# Patient Record
Sex: Male | Born: 1980 | Race: Black or African American | Hispanic: No | Marital: Single
Health system: Southern US, Community
[De-identification: ages and names within clinical notes are randomized; demographics above are authoritative.]

## PROBLEM LIST (undated history)

## (undated) DIAGNOSIS — J45909 Unspecified asthma, uncomplicated: Secondary | ICD-10-CM

---

## 2004-02-15 ENCOUNTER — Emergency Department (HOSPITAL_COMMUNITY): Admission: EM | Admit: 2004-02-15 | Discharge: 2004-02-15 | Payer: Self-pay | Admitting: Emergency Medicine

## 2007-09-13 ENCOUNTER — Emergency Department (HOSPITAL_COMMUNITY): Admission: EM | Admit: 2007-09-13 | Discharge: 2007-09-14 | Payer: Self-pay | Admitting: Emergency Medicine

## 2010-12-23 LAB — RAPID URINE DRUG SCREEN, HOSP PERFORMED
Amphetamines: NOT DETECTED
Barbiturates: NOT DETECTED
Benzodiazepines: NOT DETECTED
Cocaine: POSITIVE — AB
Opiates: NOT DETECTED
Tetrahydrocannabinol: POSITIVE — AB

## 2010-12-23 LAB — POCT I-STAT, CHEM 8
BUN: 12
Calcium, Ion: 1.24
Chloride: 106
Creatinine, Ser: 1.4
Glucose, Bld: 75
HCT: 44
Hemoglobin: 15
Potassium: 3.7
Sodium: 143
TCO2: 27

## 2010-12-23 LAB — URINALYSIS, ROUTINE W REFLEX MICROSCOPIC
Bilirubin Urine: NEGATIVE
Glucose, UA: NEGATIVE
Hgb urine dipstick: NEGATIVE
Ketones, ur: NEGATIVE
Nitrite: NEGATIVE
Protein, ur: NEGATIVE
Specific Gravity, Urine: 1.022
Urobilinogen, UA: 1
pH: 8.5 — ABNORMAL HIGH

## 2012-02-04 ENCOUNTER — Emergency Department (HOSPITAL_COMMUNITY): Payer: Self-pay

## 2012-02-04 ENCOUNTER — Encounter (HOSPITAL_COMMUNITY): Payer: Self-pay | Admitting: Emergency Medicine

## 2012-02-04 ENCOUNTER — Emergency Department (HOSPITAL_COMMUNITY)
Admission: EM | Admit: 2012-02-04 | Discharge: 2012-02-04 | Disposition: A | Discharge status: Home Health | Payer: Self-pay | Attending: Emergency Medicine | Admitting: Emergency Medicine

## 2012-02-04 DIAGNOSIS — Y9379 Activity, other specified sports and athletics: Secondary | ICD-10-CM | POA: Insufficient documentation

## 2012-02-04 DIAGNOSIS — I10 Essential (primary) hypertension: Secondary | ICD-10-CM | POA: Insufficient documentation

## 2012-02-04 DIAGNOSIS — F17200 Nicotine dependence, unspecified, uncomplicated: Secondary | ICD-10-CM | POA: Insufficient documentation

## 2012-02-04 DIAGNOSIS — S6390XA Sprain of unspecified part of unspecified wrist and hand, initial encounter: Secondary | ICD-10-CM | POA: Insufficient documentation

## 2012-02-04 DIAGNOSIS — Y929 Unspecified place or not applicable: Secondary | ICD-10-CM | POA: Insufficient documentation

## 2012-02-04 DIAGNOSIS — T5991XA Toxic effect of unspecified gases, fumes and vapors, accidental (unintentional), initial encounter: Secondary | ICD-10-CM | POA: Insufficient documentation

## 2012-02-04 DIAGNOSIS — S63602A Unspecified sprain of left thumb, initial encounter: Secondary | ICD-10-CM

## 2012-02-04 HISTORY — DX: Unspecified asthma, uncomplicated: J45.909
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
## 2012-02-04 NOTE — ED Notes (Signed)
Patient transported to X-ray 
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
## 2012-04-14 ENCOUNTER — Emergency Department (HOSPITAL_COMMUNITY): Payer: Self-pay

## 2012-04-14 ENCOUNTER — Encounter (HOSPITAL_COMMUNITY): Payer: Self-pay | Admitting: Emergency Medicine

## 2012-04-14 ENCOUNTER — Emergency Department (HOSPITAL_COMMUNITY)
Admission: EM | Admit: 2012-04-14 | Discharge: 2012-04-14 | Disposition: A | Discharge status: Home / Self Care | Payer: Self-pay | Attending: Emergency Medicine | Admitting: Emergency Medicine

## 2012-04-14 DIAGNOSIS — J45909 Unspecified asthma, uncomplicated: Secondary | ICD-10-CM | POA: Insufficient documentation

## 2012-04-14 DIAGNOSIS — R51 Headache: Secondary | ICD-10-CM | POA: Insufficient documentation

## 2012-04-14 DIAGNOSIS — R531 Weakness: Secondary | ICD-10-CM

## 2012-04-14 DIAGNOSIS — F17200 Nicotine dependence, unspecified, uncomplicated: Secondary | ICD-10-CM | POA: Insufficient documentation

## 2012-04-14 DIAGNOSIS — R5381 Other malaise: Secondary | ICD-10-CM | POA: Insufficient documentation

## 2012-04-14 LAB — BASIC METABOLIC PANEL
BUN: 12 mg/dL (ref 6–23)
CO2: 25 mEq/L (ref 19–32)
Calcium: 10 mg/dL (ref 8.4–10.5)
Chloride: 99 mEq/L (ref 96–112)
Creatinine, Ser: 1.06 mg/dL (ref 0.50–1.35)
GFR calc Af Amer: >90 mL/min (ref >90)
Glucose, Bld: 74 mg/dL (ref 70–99)
Potassium: 3.3 mEq/L — ABNORMAL LOW (ref 3.5–5.1)

## 2012-04-14 LAB — POCT I-STAT TROPONIN I: Troponin i, poc: 0 ng/mL (ref 0.00–0.08)

## 2012-04-14 LAB — ETHANOL: Alcohol, Ethyl (B): 62 mg/dL — ABNORMAL HIGH (ref 0–11)

## 2012-04-14 MED ORDER — LORAZEPAM 2 MG/ML IJ SOLN
1.0000 mg | Freq: Once | INTRAMUSCULAR | Status: AC
  Administered 2012-04-14: 1 mg via INTRAVENOUS
  Filled 2012-04-14: qty 1

## 2012-04-14 MED ORDER — ONDANSETRON HCL 4 MG/2ML IJ SOLN
4.0000 mg | Freq: Once | INTRAMUSCULAR | Status: AC
  Administered 2012-04-14: 4 mg via INTRAVENOUS
  Filled 2012-04-14: qty 2

## 2012-04-14 MED ORDER — SODIUM CHLORIDE 0.9 % IV BOLUS (SEPSIS)
1000.0000 mL | Freq: Once | INTRAVENOUS | Status: AC
  Administered 2012-04-14: 1000 mL via INTRAVENOUS

## 2012-04-14 MED ORDER — SODIUM CHLORIDE 0.9 % IV SOLN: INTRAVENOUS | Status: DC
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
## 2012-04-14 NOTE — ED Provider Notes (Signed)
History     CSN: [id number]  Arrival date & time 04/14/12  4401   First MD Initiated Contact with Patient 04/14/12 1119      Chief Complaint  Patient presents with  . Chest Pain  . Headache    (Consider location/radiation/quality/duration/timing/severity/associated sxs/prior treatment) Patient is a 32 y.o. male presenting with chest pain and headaches. The history is provided by the patient.  Chest Pain    Headache    patient here with headache and weakness after taking alcohol last night and smoking marijuana. No anginal type chest pain. No severe dyspnea or cough. Denies abdominal pain or vomiting. States he feels tired. Patient notes he did not sleep all last night. Denies any other illicit drug use at this time. No prior history of same. Take some medications currently at this time  Past Medical History  Diagnosis Date  . Asthma     History reviewed. No pertinent past surgical history.  History reviewed. No pertinent family history.  History  Substance Use Topics  . Smoking status: Current Every Day Smoker  . Smokeless tobacco: Not on file  . Alcohol Use: Yes      Review of Systems  Cardiovascular: Positive for chest pain.  Neurological: Positive for headaches.  All other systems reviewed and are negative.    Allergies  Review of patient's allergies indicates no known allergies.  Home Medications  No current outpatient prescriptions on file.  BP 106/64  Pulse 84  Temp 98.4 F (36.9 C) (Oral)  Resp 18  SpO2 97%  Physical Exam  Nursing note and vitals reviewed. Constitutional: He is oriented to person, place, and time. He appears well-developed and well-nourished.  Non-toxic appearance. No distress.  HENT:  Head: Normocephalic and atraumatic.  Eyes: Conjunctivae normal, EOM and lids are normal. Pupils are equal, round, and reactive to light.  Neck: Normal range of motion. Neck supple. No tracheal deviation present. No mass present.    Cardiovascular: Normal rate, regular rhythm and normal heart sounds.  Exam reveals no gallop.   No murmur heard. Pulmonary/Chest: Effort normal and breath sounds normal. No stridor. No respiratory distress. He has no decreased breath sounds. He has no wheezes. He has no rhonchi. He has no rales.  Abdominal: Soft. Normal appearance and bowel sounds are normal. He exhibits no distension. There is no tenderness. There is no rebound and no CVA tenderness.  Musculoskeletal: Normal range of motion. He exhibits no edema and no tenderness.  Neurological: He is alert and oriented to person, place, and time. He has normal strength. No cranial nerve deficit or sensory deficit. GCS eye subscore is 4. GCS verbal subscore is 5. GCS motor subscore is 6.  Skin: Skin is warm and dry. No abrasion and no rash noted.  Psychiatric: His affect is blunt. His speech is delayed. He is slowed.    ED Course  Procedures (including critical care time)  Labs Reviewed  BASIC METABOLIC PANEL - Abnormal; Notable for the following:    Potassium 3.3 (*)     All other components within normal limits  POCT I-STAT TROPONIN I   Dg Chest 2 View  04/14/2012  *RADIOLOGY REPORT*  Clinical Data: Chest pain, headache, dizziness  CHEST - 2 VIEW  Comparison: None.  Findings: Normal cardiac silhouette and mediastinal contours.  No focal parenchymal opacities.  No pleural effusion or pneumothorax. No acute osseous abnormalities.  IMPRESSION: No acute cardiopulmonary disease.   Original Report Authenticated By: Tacey Ruiz, MD  No diagnosis found.    MDM  Patient given IV fluids and meds here feels better. Suspect his symptoms are from his alcohol use. He is stable for discharge        Toy Baker, MD 04/14/12 1323

## 2012-04-14 NOTE — ED Notes (Signed)
Pt instructed to notify RN with he is able to provide urine sample.

## 2012-04-14 NOTE — ED Notes (Addendum)
Pt mother visiting and asked RN about pt condition. Pt verified that RN could speak about his condition to his mother without restriction.

## 2014-09-05 ENCOUNTER — Emergency Department (HOSPITAL_COMMUNITY)
Admission: EM | Admit: 2014-09-05 | Discharge: 2014-09-05 | Disposition: A | Discharge status: Home / Self Care | Payer: Self-pay | Attending: Emergency Medicine | Admitting: Emergency Medicine

## 2014-09-05 ENCOUNTER — Encounter (HOSPITAL_COMMUNITY): Payer: Self-pay | Admitting: *Deleted

## 2014-09-05 ENCOUNTER — Other Ambulatory Visit: Payer: Self-pay

## 2014-09-05 DIAGNOSIS — Z79899 Other long term (current) drug therapy: Secondary | ICD-10-CM | POA: Insufficient documentation

## 2014-09-05 DIAGNOSIS — J45909 Unspecified asthma, uncomplicated: Secondary | ICD-10-CM | POA: Insufficient documentation

## 2014-09-05 DIAGNOSIS — F1410 Cocaine abuse, uncomplicated: Secondary | ICD-10-CM | POA: Insufficient documentation

## 2014-09-05 DIAGNOSIS — R630 Anorexia: Secondary | ICD-10-CM | POA: Insufficient documentation

## 2014-09-05 DIAGNOSIS — Z720 Tobacco use: Secondary | ICD-10-CM | POA: Insufficient documentation

## 2014-09-05 DIAGNOSIS — R0789 Other chest pain: Secondary | ICD-10-CM | POA: Insufficient documentation

## 2014-09-05 LAB — BASIC METABOLIC PANEL
Anion gap: 11 (ref 5–15)
BUN: 9 mg/dL (ref 6–20)
CHLORIDE: 103 mmol/L (ref 101–111)
CO2: 23 mmol/L (ref 22–32)
Calcium: 9.8 mg/dL (ref 8.9–10.3)
Creatinine, Ser: 1.35 mg/dL — ABNORMAL HIGH (ref 0.61–1.24)
GFR calc Af Amer: >60 mL/min (ref >60)
GFR calc non Af Amer: >60 mL/min (ref >60)
Glucose, Bld: 95 mg/dL (ref 65–99)
Potassium: 3 mmol/L — ABNORMAL LOW (ref 3.5–5.1)
Sodium: 137 mmol/L (ref 135–145)

## 2014-09-05 LAB — CBC
HEMATOCRIT: 41.5 % (ref 39.0–52.0)
Hemoglobin: 14.2 g/dL (ref 13.0–17.0)
MCH: 28.8 pg (ref 26.0–34.0)
MCHC: 34.2 g/dL (ref 30.0–36.0)
MCV: 84.2 fL (ref 78.0–100.0)
PLATELETS: 310 10*3/uL (ref 150–400)
RBC: 4.93 MIL/uL (ref 4.22–5.81)
RDW: 12.3 % (ref 11.5–15.5)
WBC: 8.4 10*3/uL (ref 4.0–10.5)

## 2014-09-05 LAB — I-STAT TROPONIN, ED
TROPONIN I, POC: 0 ng/mL (ref 0.00–0.08)
Troponin i, poc: 0 ng/mL (ref 0.00–0.08)

## 2014-09-05 MED ORDER — POTASSIUM CHLORIDE CRYS ER 20 MEQ PO TBCR
40.0000 meq | EXTENDED_RELEASE_TABLET | Freq: Once | ORAL | Status: AC
  Administered 2014-09-05: 40 meq via ORAL
  Filled 2014-09-05: qty 2

## 2014-09-05 MED ORDER — SODIUM CHLORIDE 0.9 % IV BOLUS (SEPSIS)
1000.0000 mL | Freq: Once | INTRAVENOUS | Status: AC
  Administered 2014-09-05: 1000 mL via INTRAVENOUS

## 2014-09-05 MED ORDER — LORAZEPAM 2 MG/ML IJ SOLN
0.5000 mg | Freq: Once | INTRAMUSCULAR | Status: AC
  Administered 2014-09-05: 0.5 mg via INTRAVENOUS
  Filled 2014-09-05: qty 1

## 2014-09-05 NOTE — ED Notes (Signed)
Pt reporting visual hallucinations. "My shirt is moving on the bedside table." states he sees things moving above him. Dr. Jodi Mourning made aware. At bedside.

## 2014-09-05 NOTE — ED Notes (Signed)
Pt reports onset of feeling sick and cp after doing cocaine this am.

## 2014-09-05 NOTE — Discharge Instructions (Signed)
Please seek help for cocaine abuse.  If you were given medicines take as directed.  If you are on coumadin or contraceptives realize their levels and effectiveness is altered by many different medicines.  If you have any reaction (rash, tongues swelling, other) to the medicines stop taking and see a physician.    If your blood pressure was elevated in the ER make sure you follow up for management with a primary doctor or return for chest pain, shortness of breath or stroke symptoms.  Please follow up as directed and return to the ER or see a physician for new or worsening symptoms.  Thank you. Filed Vitals:   09/05/14 1015 09/05/14 1030 09/05/14 1045 09/05/14 1151  BP: 85/71 104/76 100/76 101/63  Pulse: 85 84 78 84  Temp:      TempSrc:      Resp: 19 17 15 22   SpO2: 99% 100% 99% 99%

## 2014-09-05 NOTE — ED Provider Notes (Signed)
CSN: [id number]     Arrival date & time 09/05/14  0805 History   First MD Initiated Contact with Patient 09/05/14 [phone]     Chief Complaint  Patient presents with  . Chest Pain     (Consider location/radiation/quality/duration/timing/severity/associated sxs/prior Treatment) HPI Comments: 34 year old male with cigarette smoking and cocaine drug abuse history presents with chest pain worse with position in the front happen after using cocaine around 8:00 this morning. Chest pains for the most part resolved. Nonradiating, no cardiac history. No family history of early cardiac issues known or sudden death. Patient does not want to seek help at this time for drug abuse. Patient did have episode of syncope as well afterwards. No recent surgery, no blood clot history.  Patient is a 34 y.o. male presenting with chest pain. The history is provided by the patient.  Chest Pain Associated symptoms: no abdominal pain, no back pain, no fever, no headache, no shortness of breath and not vomiting     Past Medical History  Diagnosis Date  . Asthma    History reviewed. No pertinent past surgical history. History reviewed. No pertinent family history. History  Substance Use Topics  . Smoking status: Current Every Day Smoker  . Smokeless tobacco: Not on file  . Alcohol Use: Yes    Review of Systems  Constitutional: Positive for appetite change. Negative for fever and chills.  HENT: Negative for congestion.   Eyes: Negative for visual disturbance.  Respiratory: Negative for shortness of breath.   Cardiovascular: Positive for chest pain. Negative for leg swelling.  Gastrointestinal: Negative for vomiting and abdominal pain.  Genitourinary: Negative for dysuria and flank pain.  Musculoskeletal: Negative for back pain, neck pain and neck stiffness.  Skin: Negative for rash.  Neurological: Negative for light-headedness and headaches.      Allergies  Review of patient's allergies indicates no  known allergies.  Home Medications   Prior to Admission medications   Medication Sig Start Date End Date Taking? Authorizing Provider  ALBUTEROL IN Inhale 3 puffs into the lungs every 4 (four) hours as needed (shortness of breath).   Yes Historical Provider, MD   BP 101/63 mmHg  Pulse 84  Temp(Src) 98.1 F (36.7 C) (Oral)  Resp 22  SpO2 99% Physical Exam  Constitutional: He is oriented to person, place, and time. He appears well-developed and well-nourished.  HENT:  Head: Normocephalic and atraumatic.  Eyes: Conjunctivae are normal. Right eye exhibits no discharge. Left eye exhibits no discharge.  Neck: Normal range of motion. Neck supple. No tracheal deviation present.  Cardiovascular: Normal rate, regular rhythm and intact distal pulses.   Pulmonary/Chest: Effort normal and breath sounds normal.  Abdominal: Soft. He exhibits no distension. There is no tenderness. There is no guarding.  Musculoskeletal: He exhibits tenderness (tender to palpation anterior lower chest). He exhibits no edema.  Neurological: He is alert and oriented to person, place, and time.  Skin: Skin is warm. No rash noted.  Psychiatric: He has a normal mood and affect.  Nursing note and vitals reviewed.   ED Course  Procedures (including critical care time) Labs Review Labs Reviewed  BASIC METABOLIC PANEL - Abnormal; Notable for the following:    Potassium 3.0 (*)    Creatinine, Ser 1.35 (*)    All other components within normal limits  CBC  I-STAT TROPOININ, ED  I-STAT TROPOININ, ED    Imaging Review No results found.   EKG Interpretation   Date/Time:  Friday September 05 2014 08:10:14  EDT Ventricular Rate:  113 PR Interval:  134 QRS Duration: 82 QT Interval:  332 QTC Calculation: 455 R Axis:   80 Text Interpretation:  Sinus tachycardia Otherwise normal ECG Confirmed by  Amanie Mcculley  MD, Nickson Middlesworth ([id number]) on 09/05/2014 8:39:45 AM      MDM   Final diagnoses:  Cocaine abuse  Chest pain, atypical    Patient with drug abuse presents with atypical chest pain after using cocaine. Discussion with patient regarding risks of cocaine importance of follow-up seeking help when he is ready. Patient low risk cardiac, delta troponin negative. Follow-up outpatient discussed  If you were given medicines take as directed.  If you are on coumadin or contraceptives realize their levels and effectiveness is altered by many different medicines.  If you have any reaction (rash, tongues swelling, other) to the medicines stop taking and see a physician.    If your blood pressure was elevated in the ER make sure you follow up for management with a primary doctor or return for chest pain, shortness of breath or stroke symptoms.  Please follow up as directed and return to the ER or see a physician for new or worsening symptoms.  Thank you. Filed Vitals:   09/05/14 1015 09/05/14 1030 09/05/14 1045 09/05/14 1151  BP: 85/71 104/76 100/76 101/63  Pulse: 85 84 78 84  Temp:      TempSrc:      Resp: SpO2: 99% 100% 99% 99%       Blane Ohara, MD 09/05/14 1241

## 2014-09-21 ENCOUNTER — Emergency Department (HOSPITAL_COMMUNITY)
Admission: EM | Admit: 2014-09-21 | Discharge: 2014-09-21 | Disposition: A | Discharge status: Home / Self Care | Payer: Self-pay | Attending: Emergency Medicine | Admitting: Emergency Medicine

## 2014-09-21 ENCOUNTER — Encounter (HOSPITAL_COMMUNITY): Payer: Self-pay | Admitting: *Deleted

## 2014-09-21 ENCOUNTER — Emergency Department (HOSPITAL_COMMUNITY): Payer: Self-pay

## 2014-09-21 DIAGNOSIS — Y998 Other external cause status: Secondary | ICD-10-CM | POA: Insufficient documentation

## 2014-09-21 DIAGNOSIS — Y929 Unspecified place or not applicable: Secondary | ICD-10-CM | POA: Insufficient documentation

## 2014-09-21 DIAGNOSIS — S60221A Contusion of right hand, initial encounter: Secondary | ICD-10-CM | POA: Insufficient documentation

## 2014-09-21 DIAGNOSIS — J45909 Unspecified asthma, uncomplicated: Secondary | ICD-10-CM | POA: Insufficient documentation

## 2014-09-21 DIAGNOSIS — Y9389 Activity, other specified: Secondary | ICD-10-CM | POA: Insufficient documentation

## 2014-09-21 DIAGNOSIS — Z79899 Other long term (current) drug therapy: Secondary | ICD-10-CM | POA: Insufficient documentation

## 2014-09-21 DIAGNOSIS — X58XXXA Exposure to other specified factors, initial encounter: Secondary | ICD-10-CM | POA: Insufficient documentation

## 2014-09-21 DIAGNOSIS — Z720 Tobacco use: Secondary | ICD-10-CM | POA: Insufficient documentation

## 2014-09-21 MED ORDER — IBUPROFEN 600 MG PO TABS: 600.0000 mg | ORAL_TABLET | Freq: Four times a day (QID) | ORAL | Status: DC | PRN
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
## 2014-09-21 NOTE — ED Notes (Signed)
Declined W/C at D/C and was escorted to lobby by RN. 

## 2014-09-21 NOTE — Discharge Instructions (Signed)
Please read and follow all provided instructions.  Your diagnoses today include:  1. Contusion of right hand, initial encounter     Tests performed today include:  An x-ray of your hand - does NOT show any broken bones  Vital signs. See below for your results today.   Medications prescribed:   Ibuprofen (Motrin, Advil) - anti-inflammatory pain medication  Do not exceed 600mg  ibuprofen every 6 hours, take with food  You have been prescribed an anti-inflammatory medication or NSAID. Take with food. Take smallest effective dose for the shortest duration needed for your pain. Stop taking if you experience stomach pain or vomiting.  Take any prescribed medications only as directed.  Home care instructions:   Follow any educational materials contained in this packet  Follow R.I.C.E. Protocol:  R - rest your injury   I  - use ice on injury without applying directly to skin  C - compress injury with bandage or splint  E - elevate the injury above the level of your heart as much as possible to reduce pain and swelling  Follow-up instructions: Please follow-up with your primary care provider if you continue to have significant pain or trouble using your wrist in 1 week. In this case you may have a severe injury that requires further care.   Generally, when wrists are moderately tender to touch following a fall or injury, a fracture (break in bone) may be present. Because of this, even if your x-rays were normal today, it is important that you receive follow-up care as suggested (you could still have a broken bone).  Return instructions:   Please return if your fingers are numb or tingling, appear very red, white, gray or blue, or you have severe pain (also elevate wrist and loosen splint or wrap)  Please return if you have difficulty moving your fingers.  Please return to the Emergency Department if you experience worsening symptoms.   Please return if you have any other  emergent concerns.  Additional Information:  Your vital signs today were: BP 118/77 mmHg   Pulse 75   Temp(Src) 97.9 F (36.6 C) (Oral)   Resp 16   Ht 5\' 6"  (1.676 m)   Wt 174 lb (78.926 kg)   BMI 28.10 kg/m2   SpO2 100% If your blood pressure (BP) was elevated above 135/85 this visit, please have this repeated by your doctor within one month. -------------- Wrist injuries are frequent in adults and children. A sprain is an injury to the ligaments that hold your bones together. A strain is an injury to muscle or muscle tendons (cord like structure) from stretching or pulling.   Remember the importance of follow-up and possible follow-up x-rays. Improvement in pain level is not 100% insurance of not having a fracture. --------------

## 2015-03-11 ENCOUNTER — Encounter (HOSPITAL_COMMUNITY): Payer: Self-pay | Admitting: Vascular Surgery

## 2015-03-11 ENCOUNTER — Emergency Department (HOSPITAL_COMMUNITY): Payer: Self-pay

## 2015-03-11 ENCOUNTER — Emergency Department (HOSPITAL_COMMUNITY)
Admission: EM | Admit: 2015-03-11 | Discharge: 2015-03-11 | Disposition: A | Discharge status: Home / Self Care | Payer: Self-pay | Attending: Emergency Medicine | Admitting: Emergency Medicine

## 2015-03-11 DIAGNOSIS — Z79899 Other long term (current) drug therapy: Secondary | ICD-10-CM | POA: Insufficient documentation

## 2015-03-11 DIAGNOSIS — S90121A Contusion of right lesser toe(s) without damage to nail, initial encounter: Secondary | ICD-10-CM | POA: Insufficient documentation

## 2015-03-11 DIAGNOSIS — J45909 Unspecified asthma, uncomplicated: Secondary | ICD-10-CM | POA: Insufficient documentation

## 2015-03-11 DIAGNOSIS — Y9289 Other specified places as the place of occurrence of the external cause: Secondary | ICD-10-CM | POA: Insufficient documentation

## 2015-03-11 DIAGNOSIS — W208XXA Other cause of strike by thrown, projected or falling object, initial encounter: Secondary | ICD-10-CM | POA: Insufficient documentation

## 2015-03-11 DIAGNOSIS — Y998 Other external cause status: Secondary | ICD-10-CM | POA: Insufficient documentation

## 2015-03-11 DIAGNOSIS — Y9389 Activity, other specified: Secondary | ICD-10-CM | POA: Insufficient documentation

## 2015-03-11 DIAGNOSIS — F17210 Nicotine dependence, cigarettes, uncomplicated: Secondary | ICD-10-CM | POA: Insufficient documentation

## 2015-03-11 MED ORDER — NAPROXEN 375 MG PO TABS: 375.0000 mg | ORAL_TABLET | Freq: Two times a day (BID) | ORAL | Status: AC

## 2015-03-11 NOTE — Discharge Instructions (Signed)
Contusion °A contusion is a deep bruise. Contusions are the result of a blunt injury to tissues and muscle fibers under the skin. The injury causes bleeding under the skin. The skin overlying the contusion may turn blue, purple, or yellow. Minor injuries will give you a painless contusion, but more severe contusions may stay painful and swollen for a few weeks.  °CAUSES  °This condition is usually caused by a blow, trauma, or direct force to an area of the body. °SYMPTOMS  °Symptoms of this condition include: °· Swelling of the injured area. °· Pain and tenderness in the injured area. °· Discoloration. The area may have redness and then turn blue, purple, or yellow. °DIAGNOSIS  °This condition is diagnosed based on a physical exam and medical history. An X-ray, CT scan, or MRI may be needed to determine if there are any associated injuries, such as broken bones (fractures). °TREATMENT  °Specific treatment for this condition depends on what area of the body was injured. In general, the best treatment for a contusion is resting, icing, applying pressure to (compression), and elevating the injured area. This is often called the RICE strategy. Over-the-counter anti-inflammatory medicines may also be recommended for pain control.  °HOME CARE INSTRUCTIONS  °· Rest the injured area. °· If directed, apply ice to the injured area: °· Put ice in a plastic bag. °· Place a towel between your skin and the bag. °· Leave the ice on for 20 minutes, 2-3 times per day. °· If directed, apply light compression to the injured area using an elastic bandage. Make sure the bandage is not wrapped too tightly. Remove and reapply the bandage as directed by your health care provider. °· If possible, raise (elevate) the injured area above the level of your heart while you are sitting or lying down. °· Take over-the-counter and prescription medicines only as told by your health care provider. °SEEK MEDICAL CARE IF: °· Your symptoms do not  improve after several days of treatment. °· Your symptoms get worse. °· You have difficulty moving the injured area. °SEEK IMMEDIATE MEDICAL CARE IF:  °· You have severe pain. °· You have numbness in a hand or foot. °· Your hand or foot turns pale or cold. °  °This information is not intended to replace advice given to you by your health care provider. Make sure you discuss any questions you have with your health care provider. °  °Document Released: 12/22/2004 Document Revised: 12/03/2014 Document Reviewed: 07/30/2014 °Elsevier Interactive Patient Education ©2016 Elsevier Inc. ° °Cryotherapy °Cryotherapy means treatment with cold. Ice or gel packs can be used to reduce both pain and swelling. Ice is the most helpful within the first 24 to 48 hours after an injury or flare-up from overusing a muscle or joint. Sprains, strains, spasms, burning pain, shooting pain, and aches can all be eased with ice. Ice can also be used when recovering from surgery. Ice is effective, has very few side effects, and is safe for most people to use. °PRECAUTIONS  °Ice is not a safe treatment option for people with: °· Raynaud phenomenon. This is a condition affecting small blood vessels in the extremities. Exposure to cold may cause your problems to return. °· Cold hypersensitivity. There are many forms of cold hypersensitivity, including: °¨ Cold urticaria. Red, itchy hives appear on the skin when the tissues begin to warm after being iced. °¨ Cold erythema. This is a red, itchy rash caused by exposure to cold. °¨ Cold hemoglobinuria. Red blood cells   break down when the tissues begin to warm after being iced. The hemoglobin that carry oxygen are passed into the urine because they cannot combine with blood proteins fast enough. °· Numbness or altered sensitivity in the area being iced. °If you have any of the following conditions, do not use ice until you have discussed cryotherapy with your caregiver: °· Heart conditions, such as  arrhythmia, angina, or chronic heart disease. °· High blood pressure. °· Healing wounds or open skin in the area being iced. °· Current infections. °· Rheumatoid arthritis. °· Poor circulation. °· Diabetes. °Ice slows the blood flow in the region it is applied. This is beneficial when trying to stop inflamed tissues from spreading irritating chemicals to surrounding tissues. However, if you expose your skin to cold temperatures for too long or without the proper protection, you can damage your skin or nerves. Watch for signs of skin damage due to cold. °HOME CARE INSTRUCTIONS °Follow these tips to use ice and cold packs safely. °· Place a dry or damp towel between the ice and skin. A damp towel will cool the skin more quickly, so you may need to shorten the time that the ice is used. °· For a more rapid response, add gentle compression to the ice. °· Ice for no more than 10 to 20 minutes at a time. The bonier the area you are icing, the less time it will take to get the benefits of ice. °· Check your skin after 5 minutes to make sure there are no signs of a poor response to cold or skin damage. °· Rest 20 minutes or more between uses. °· Once your skin is numb, you can end your treatment. You can test numbness by very lightly touching your skin. The touch should be so light that you do not see the skin dimple from the pressure of your fingertip. When using ice, most people will feel these normal sensations in this order: cold, burning, aching, and numbness. °· Do not use ice on someone who cannot communicate their responses to pain, such as small children or people with dementia. °HOW TO MAKE AN ICE PACK °Ice packs are the most common way to use ice therapy. Other methods include ice massage, ice baths, and cryosprays. Muscle creams that cause a cold, tingly feeling do not offer the same benefits that ice offers and should not be used as a substitute unless recommended by your caregiver. °To make an ice pack, do one  of the following: °· Place crushed ice or a bag of frozen vegetables in a sealable plastic bag. Squeeze out the excess air. Place this bag inside another plastic bag. Slide the bag into a pillowcase or place a damp towel between your skin and the bag. °· Mix 3 parts water with 1 part rubbing alcohol. Freeze the mixture in a sealable plastic bag. When you remove the mixture from the freezer, it will be slushy. Squeeze out the excess air. Place this bag inside another plastic bag. Slide the bag into a pillowcase or place a damp towel between your skin and the bag. °SEEK MEDICAL CARE IF: °· You develop white spots on your skin. This may give the skin a blotchy (mottled) appearance. °· Your skin turns blue or pale. °· Your skin becomes waxy or hard. °· Your swelling gets worse. °MAKE SURE YOU:  °· Understand these instructions. °· Will watch your condition. °· Will get help right away if you are not doing well or get worse. °  °  This information is not intended to replace advice given to you by your health care provider. Make sure you discuss any questions you have with your health care provider. °  °Document Released: 11/08/2010 Document Revised: 04/04/2014 Document Reviewed: 11/08/2010 °Elsevier Interactive Patient Education ©2016 Elsevier Inc. ° °

## 2015-03-11 NOTE — ED Notes (Signed)
Declined W/C at D/C and was escorted to lobby by RN. 

## 2015-03-11 NOTE — ED Notes (Signed)
Pt reports to the ED for eval of right middle and 2nd toe pain. He reports he was at work yesterday and he dropped a piece of plywood on his foot. Swelling and ecchymosis noted. Pt A&OX4, resp e/u, and skin warm and dry.

## 2015-03-11 NOTE — ED Provider Notes (Signed)
CSN: [id number]     Arrival date & time 03/11/15  1352 History  By signing my name below, I, Brent Vargas, attest that this documentation has been prepared under the direction and in the presence of Arthor Captain, PA-C Electronically Signed: Charline Bills, ED Scribe 03/11/2015 at 2:51 PM.   Chief Complaint  Patient presents with  . Toe Pain   The history is provided by the patient. No language interpreter was used.   HPI Comments: CHISTIAN Vargas is a 34 y.o. male who presents to the Emergency Department complaining of constant right second toe pain onset yesterday. Pt states that he was carrying plywood when he dropped it on his foot. He reports constant pain that is exacerbated with palpation and bearing weight, bruising and swelling. No treatments tried PTA.   Past Medical History  Diagnosis Date  . Asthma    History reviewed. No pertinent past surgical history. No family history on file. Social History  Substance Use Topics  . Smoking status: Current Every Day Smoker    Types: Cigarettes  . Smokeless tobacco: Never Used  . Alcohol Use: Yes    Review of Systems  Musculoskeletal: Positive for joint swelling and arthralgias.  Skin: Positive for color change.   Allergies  Bee venom  Home Medications   Prior to Admission medications   Medication Sig Start Date End Date Taking? Authorizing Provider  ALBUTEROL IN Inhale 3 puffs into the lungs every 4 (four) hours as needed (shortness of breath).    Historical Provider, MD  ibuprofen (ADVIL,MOTRIN) 600 MG tablet Take 1 tablet (600 mg total) by mouth every 6 (six) hours as needed. 09/21/14   Renne Crigler, PA-C   BP 118/77 mmHg  Pulse 77  Temp(Src) 98.4 F (36.9 C) (Oral)  Resp 16  SpO2 100% Physical Exam  Constitutional: He is oriented to person, place, and time. He appears well-developed and well-nourished. No distress.  HENT:  Head: Normocephalic and atraumatic.  Eyes: Conjunctivae and EOM are normal.  Neck: Neck  supple. No tracheal deviation present.  Cardiovascular: Normal rate.   Pulmonary/Chest: Effort normal. No respiratory distress.  Musculoskeletal: Normal range of motion.  R foot: 2nd toe is discolored with bruising. Tender to palpation. Able to move toes. Good cap refill.   Neurological: He is alert and oriented to person, place, and time.  Skin: Skin is warm and dry.  Psychiatric: He has a normal mood and affect. His behavior is normal.  Nursing note and vitals reviewed.  ED Course  Procedures (including critical care time) DIAGNOSTIC STUDIES: Oxygen Saturation is 100% on RA, normal by my interpretation.    COORDINATION OF CARE: 2:49 PM-Discussed treatment plan which includes XR and crutches with pt at bedside and pt agreed to plan.   Labs Review Labs Reviewed - No data to display  Imaging Review Dg Foot Complete Right  03/11/2015  CLINICAL DATA:  Pain after dropping piece of plywood on foot 1 day prior. EXAM: RIGHT FOOT COMPLETE - 3+ VIEW COMPARISON:  None. FINDINGS: Frontal, oblique, and lateral views obtained. There is no demonstrable fracture or dislocation. Joint spaces appear intact. No erosive change. IMPRESSION: No fracture or dislocation.  No apparent arthropathy. Electronically Signed   By: Bretta Bang III M.D.   On: 03/11/2015 14:33   I have personally reviewed and evaluated these images and lab results as part of my medical decision-making.   EKG Interpretation None      MDM  Patient X-Ray negative for obvious fracture  or dislocation. Pain managed in ED. Pt advised to follow up with orthopedics if symptoms persist for possibility of missed fracture diagnosis. Patient given brace while in ED, conservative therapy recommended and discussed. Patient will be dc home & is agreeable with above plan.  Final diagnoses:  Contusion of toe of right foot, initial encounter    I personally performed the services described in this documentation, which was scribed in my  presence. The recorded information has been reviewed and is accurate.         Arthor Captainbigail Thresa Dozier, PA-C 03/11/15 1458  Tilden FossaElizabeth Rees, MD 03/12/15 [phone]

## 2015-03-24 ENCOUNTER — Emergency Department (HOSPITAL_COMMUNITY)
Admission: EM | Admit: 2015-03-24 | Discharge: 2015-03-24 | Disposition: A | Discharge status: Home / Self Care | Payer: Self-pay | Attending: Emergency Medicine | Admitting: Emergency Medicine

## 2015-03-24 ENCOUNTER — Emergency Department (HOSPITAL_COMMUNITY): Payer: Self-pay

## 2015-03-24 ENCOUNTER — Encounter (HOSPITAL_COMMUNITY): Payer: Self-pay | Admitting: *Deleted

## 2015-03-24 DIAGNOSIS — F17210 Nicotine dependence, cigarettes, uncomplicated: Secondary | ICD-10-CM | POA: Insufficient documentation

## 2015-03-24 DIAGNOSIS — M79641 Pain in right hand: Secondary | ICD-10-CM

## 2015-03-24 DIAGNOSIS — Y990 Civilian activity done for income or pay: Secondary | ICD-10-CM | POA: Insufficient documentation

## 2015-03-24 DIAGNOSIS — W270XXA Contact with workbench tool, initial encounter: Secondary | ICD-10-CM | POA: Insufficient documentation

## 2015-03-24 DIAGNOSIS — Y9289 Other specified places as the place of occurrence of the external cause: Secondary | ICD-10-CM | POA: Insufficient documentation

## 2015-03-24 DIAGNOSIS — J45909 Unspecified asthma, uncomplicated: Secondary | ICD-10-CM | POA: Insufficient documentation

## 2015-03-24 DIAGNOSIS — S6991XA Unspecified injury of right wrist, hand and finger(s), initial encounter: Secondary | ICD-10-CM | POA: Insufficient documentation

## 2015-03-24 DIAGNOSIS — Y9389 Activity, other specified: Secondary | ICD-10-CM | POA: Insufficient documentation

## 2015-03-24 DIAGNOSIS — Z8781 Personal history of (healed) traumatic fracture: Secondary | ICD-10-CM | POA: Insufficient documentation

## 2015-03-24 DIAGNOSIS — Z79899 Other long term (current) drug therapy: Secondary | ICD-10-CM | POA: Insufficient documentation

## 2015-03-24 MED ORDER — IBUPROFEN 600 MG PO TABS: 600.0000 mg | ORAL_TABLET | Freq: Four times a day (QID) | ORAL | Status: AC | PRN

## 2015-03-24 MED ORDER — IBUPROFEN 400 MG PO TABS
600.0000 mg | ORAL_TABLET | Freq: Once | ORAL | Status: AC
  Administered 2015-03-24: 600 mg via ORAL
  Filled 2015-03-24: qty 1

## 2015-03-24 NOTE — Discharge Instructions (Signed)
Read the information below.  Use the prescribed medication as directed.  Please discuss all new medications with your pharmacist.  You may return to the Emergency Department at any time for worsening condition or any new symptoms that concern you.  If you develop uncontrolled pain, weakness or numbness of the extremity, severe discoloration of the skin, or you are unable to use your hand, return to the ER for a recheck.       Emergency Department Resource Guide 1) Find a Doctor and Pay Out of Pocket Although you won't have to find out who is covered by your insurance plan, it is a good idea to ask around and get recommendations. You will then need to call the office and see if the doctor you have chosen will accept you as a new patient and what types of options they offer for patients who are self-pay. Some doctors offer discounts or will set up payment plans for their patients who do not have insurance, but you will need to ask so you aren't surprised when you get to your appointment.  2) Contact Your Local Health Department Not all health departments have doctors that can see patients for sick visits, but many do, so it is worth a call to see if yours does. If you don't know where your local health department is, you can check in your phone book. The CDC also has a tool to help you locate your state's health department, and many state websites also have listings of all of their local health departments.  3) Find a Walk-in Clinic If your illness is not likely to be very severe or complicated, you may want to try a walk in clinic. These are popping up all over the country in pharmacies, drugstores, and shopping centers. They're usually staffed by nurse practitioners or physician assistants that have been trained to treat common illnesses and complaints. They're usually fairly quick and inexpensive. However, if you have serious medical issues or chronic medical problems, these are probably not your best  option.  No Primary Care Doctor: - Call Health Connect at  [phone] - they can help you locate a primary care doctor that  accepts your insurance, provides certain services, etc. - Physician Referral Service- [phone]  Chronic Pain Problems: Organization         Address  Phone   Notes  [facility]  [phone] Patients need to be referred by their primary care doctor.   Medication Assistance: Organization         Address  Phone   Notes  [facility] Medication [facility] [address] [place], [place] [address] [phone] --Must be a resident of [facility] [place] -- Must have NO insurance coverage whatsoever (no Medicaid/ Medicare, etc.) -- The pt. MUST have a primary care doctor that directs their care regularly and follows them in the community   MedAssist  [phone]   [employer]  [phone]    Agencies that provide inexpensive medical care: Organization         Address  Phone   Notes  [facility]  [phone]   [facility]    [phone]   [facility] [address] [place], [place] [address] [phone]   [facility] [address], [place] [phone]   [facility]    [phone]   [facility]    [phone]  [place]  [address], [place] Phone:  [phone], Fax:  [phone] Hours of Operation:  9 am - 6 pm, M-F.  Also accepts Medicaid/Medicare and self-pay.  [facility] for [place], [address], [place] Phone: [phone], Fax: [phone]. Hours of Operation:  8:30 am - 5:30 pm, M-F.  Also accepts Medicaid and self-pay.  [facility] [address] [place] Phone: [phone]   [place], [place], [place] [phone], Ext. [phone] Mondays & Thursdays: 7-9 AM.  First 15  patients are seen on a first come, first serve basis.    [place] Providers:  Organization         Address  Phone   Notes  [facility] [address], [place] [phone] Also accepts self-pay patients.  [facility] [address], [place]  [phone]   [place], [address], [place] [phone]   [facility] Family Medicine [address] [place] [phone]   Lucianne Lei [address], [place]   [phone] Only accepts [place] Access [place] patients after they have their name applied to their card.   Self-Pay (no insurance) in [facility]:  Organization         Address  Phone   Notes  Sickle Cell Patients, [facility] [place] [phone]   [facility] [place] [phone]   [facility] [place]  [place], [place], [place] [phone]   [facility]/Dr. Osei-Bonsu  [address] [place] or [address], [place] [phone] Phone number for both [place] and [place] locations is the same.  Urgent Medical and [facility] [address] [place] [phone]   [facility] [address] [place] or [address] [phone]   [facility] [address] [place] [phone], phone; [phone], fax Sees patients 1st and 3rd Saturday of every month.  Must not qualify for public or private insurance (i.e. Medicaid, Medicare, [place] Health Choice, Veterans' Benefits)  Household income should be no more than 200% of the poverty level The clinic cannot treat you if you are pregnant or think you are pregnant  Sexually transmitted diseases are not treated at the clinic.    Dental  Care: Organization         Address  Phone  Notes  [facility] Department of [facility] [place] [phone] Accepts children up to age 86 who are enrolled in [place] or [place]; pregnant women with a Medicaid card; and children who have applied for Medicaid or [place] Health Choice, but were declined, whose parents can pay a reduced fee at time of service.  [facility] Department of [facility]  [address], [place] [phone] Accepts children up to age 51 who are enrolled in [place] or [place]; pregnant women with a Medicaid card; and children who have applied for Medicaid or [place] Health Choice, but were declined, whose parents can pay a reduced fee at time of service.  [place] Adult [facility] Access  PROGRAM  [place] [phone] Patients are seen by appointment only. Walk-ins are not accepted. [place] will see patients 24 years of age and older. Monday - Tuesday (8am-5pm) Most Wednesdays (8:30-5pm) $30 per visit, cash only  [facility] Adult [facility] Access PROGRAM  [address], [facility] [phone] Patients are seen by appointment only. Walk-ins are not accepted. [place] will see patients 16 years of age and older. One Wednesday Evening (Monthly: Volunteer Based).  $30 per visit, cash only  [place]  [phone] for adults; Children under age 49, call Graduate Pediatric Dentistry at [phone]. Children aged 70-14, please call [phone] to request a pediatric application.  Dental services are provided in all areas of dental care including fillings, crowns and bridges, complete and partial dentures, implants, gum treatment, root canals, and extractions. Preventive care is also provided. Treatment is provided to both adults and children. Patients are selected via a lottery and there is often a waiting list.   [facility] [address], [place]  [phone] [email or web address]   [facility] [address] [place], [place] [phone], Ext. [phone] Second and Fourth Thursday of each month, opens at 6:30 AM; Clinic ends at 9 AM.  Patients are seen on a first-come first-served basis, and a limited number are seen during each clinic.   [facility]  [address] [place] D[place]er [place], [place] [phone]   Eligibility Requirements You must have lived in [place], [place], or [place] counties for at least the last three months.   You cannot be eligible for state or federal sponsored [employer], including [employer], [place], or [employer].   You generally cannot be eligible for healthcare insurance through your employer.    How to apply: Eligibility screenings are held every Tuesday and Wednesday afternoon from 1:00 pm until 4:00 pm. You do not need an appointment for the interview!  [facility] [address], [place], [place]   [place]  [place] Department  Jette  [phone]    Behavioral Health Resources in the Community: Intensive Outpatient Programs Organization         Address  Phone  Notes  [place]. [address] [place], [place] [phone]   [facility] Outpatient [address], [place], [place]   ADS: Alcohol & Drug S[facility] [address] [place], [place]   [place] [address],  [place], [place] or [phone]   Substance Abuse Resources Organization         Address  Phone  Notes  Alcohol and Drug Services  [phone]   [place]  [phone]   [facility] [place]   Chinita Pester  [phone]   Residential & Outpatient Substance Abuse Program  [phone]    Psychological Services Organization         Address  Phone  Notes  [facility] [place]  [place]  [phone]   [place] [address], [place] or [phone]    Mobile Crisis Teams Organization         Address  Phone  Notes  [facility], Mobile Crisis Care Unit  [phone]   [facility]  [address] [place], [place]  [place], [place] [phone]    Self-Help/Support Groups Organization         Address  Phone             Notes  [facility]ental [facility] Assoc[facility] of [place] - variety of support groups  [place] Call for more information  Narcotics Anonymous (NA), [facility] [address], [employer] [place]  2 meetings at this location   Special educational needs teacher         Address  Phone  Notes  [facility] Residential Treatment [place],    [place]  [phone]   [facility]  [address] [place] [address], [place], [place]   [place], [place] [phone] Admissions: 8am-3pm M-F  Incentives Substance Jennifer [address]    [place], [place] [phone]   [facility] [address] [place], [place], [place]   [facility] [address],  [place], [place]   [facility] - Intensive Outpatient [address], [place], [place]   [facility] [address]  [place], [place] [phone] or [phone]   [facility] Services (RTS) [address], [place], [place] Accepts Medicaid  [facility] [place] [address]  [place] [phone] Substance Abuse/Addiction Treatment   [facility] Organization         Address  Phone  Notes  [facility]  [phone]   Domenic Schwab, PhD [address] Arlis Porta [place], [place]   [phone] or [phone]   Lillian Pease [place], [place] [phone]   [facility] [address] [place], [place] [phone] Insurance/Medicaid/sponsorship through [facility] and Families [address]                                    [place], [place] [phone] [place] [address], [place] [phone]    Dr. Adele Schilder  [phone]   [facility] Dept. 1) [address], [place] Giacobbe [place] 2) [place] 3)  [place] [address], [place] [phone]  [phone]   [place] [phone] or [phone] (After Hours)

## 2015-03-24 NOTE — ED Provider Notes (Signed)
CSN: [id number]     Arrival date & time 03/24/15  1220 History   By signing my name below, I, Brent Vargas, attest that this documentation has been prepared under the direction and in the presence of Trixie DredgeEmily Bethene Hankinson, PA-C Electronically Signed: Jarvis Morganaylor Vargas, ED Scribe. 03/24/2015. 2:21 PM.    Chief Complaint  Patient presents with  . Hand Pain   The history is provided by the patient. No language interpreter was used.    HPI Comments: Brent Vargas is a 34 y.o. male who presents to the Emergency Department complaining of constant, moderate, aching and throbbing, right hand pain that began today while using a sledge hammer at work. Pt notes he previously had a fracture to the right middle knuckle 20 years ago and has had intermittent pain since. He states the pain is exacerbated with use of the hand and with cold weather. Pt has not taken any medications prior arrival or tried any other treatments. Pt denies any new injuries to the hand. Pt is right hand dominant. He denies any numbness, weakness, swelling, bruising or redness.  Past Medical History  Diagnosis Date  . Asthma    History reviewed. No pertinent past surgical history. No family history on file. Social History  Substance Use Topics  . Smoking status: Current Every Day Smoker -- 0.50 packs/day    Types: Cigarettes  . Smokeless tobacco: Never Used  . Alcohol Use: 1.2 oz/week    2 Shots of liquor per week    Review of Systems  Constitutional: Negative for fever.  Musculoskeletal: Positive for arthralgias. Negative for joint swelling.  Skin: Negative for color change.  Allergic/Immunologic: Negative for immunocompromised state.  Neurological: Negative for weakness and numbness.  Hematological: Does not bruise/bleed easily.  Psychiatric/Behavioral: Negative for self-injury.      Allergies  Bee venom  Home Medications   Prior to Admission medications   Medication Sig Start Date End Date Taking? Authorizing  Provider  ALBUTEROL IN Inhale 3 puffs into the lungs every 4 (four) hours as needed (shortness of breath).    Historical Provider, MD  ibuprofen (ADVIL,MOTRIN) 600 MG tablet Take 1 tablet (600 mg total) by mouth every 6 (six) hours as needed. 09/21/14   Renne CriglerJoshua Geiple, PA-C  naproxen (NAPROSYN) 375 MG tablet Take 1 tablet (375 mg total) by mouth 2 (two) times daily with a meal. 03/11/15   Arthor CaptainAbigail Harris, PA-C   Triage Vitals: BP 127/70 mmHg  Pulse 76  Temp(Src) 98.5 F (36.9 C) (Oral)  Resp 18  Ht 5\' 6"  (1.676 m)  Wt 160 lb (72.576 kg)  BMI 25.84 kg/m2  SpO2 97%  Physical Exam  Constitutional: He appears well-developed and well-nourished. No distress.  HENT:  Head: Normocephalic and atraumatic.  Neck: Neck supple.  Pulmonary/Chest: Effort normal.  Musculoskeletal:  Right hand: Tenderness and chronic deformity to distal third metacarpal.  Full active range of motion of all digits, strength 5/5, sensation intact, capillary refill < 2 seconds.  Right wrist nontender.    Neurological: He is alert.  Skin: He is not diaphoretic.  Nursing note and vitals reviewed.   ED Course  Procedures (including critical care time)  DIAGNOSTIC STUDIES: Oxygen Saturation is 97% on RA, normal by my interpretation.    COORDINATION OF CARE: 2:21 PM- Will order imaging of right hand. Pt advised of plan for treatment and pt agrees.     Labs Review Labs Reviewed - No data to display  Imaging Review No results found. I have  personally reviewed and evaluated these images as part of my medical decision-making.   EKG Interpretation None      MDM   Final diagnoses:  Right hand pain    Afebrile, nontoxic patient with injury to his right hand while using a sledge hammer.  There was no impact to the hand.  Has pain over area that was broken several decades ago, frequently has pain there with cold weather.   Xray demonstrates old remodeled fracture, no acute findings.   D/C home with ibuprofen,  PCP follow up  Discussed result, findings, treatment, and follow up  with patient.  Pt given return precautions.  Pt verbalizes understanding and agrees with plan.       I personally performed the services described in this documentation, which was scribed in my presence. The recorded information has been reviewed and is accurate.     Trixie Dredge, PA-C 03/24/15 1509  Marily Memos, MD 03/24/15 [phone]
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
## 2015-04-11 ENCOUNTER — Emergency Department (HOSPITAL_COMMUNITY): Payer: Self-pay

## 2015-04-11 ENCOUNTER — Ambulatory Visit (HOSPITAL_COMMUNITY)
Admission: EM | Admit: 2015-04-11 | Discharge: 2015-04-29 | Disposition: E | Payer: Self-pay | Attending: Emergency Medicine | Admitting: Emergency Medicine

## 2015-04-11 ENCOUNTER — Encounter (HOSPITAL_COMMUNITY): Payer: Self-pay

## 2015-04-11 ENCOUNTER — Encounter (HOSPITAL_COMMUNITY): Admission: EM | Disposition: E | Payer: Self-pay | Source: Home / Self Care | Attending: Emergency Medicine

## 2015-04-11 ENCOUNTER — Emergency Department (HOSPITAL_COMMUNITY): Payer: Self-pay | Admitting: Anesthesiology

## 2015-04-11 DIAGNOSIS — S36893A Laceration of other intra-abdominal organs, initial encounter: Secondary | ICD-10-CM | POA: Insufficient documentation

## 2015-04-11 DIAGNOSIS — I469 Cardiac arrest, cause unspecified: Secondary | ICD-10-CM | POA: Insufficient documentation

## 2015-04-11 DIAGNOSIS — W3400XA Accidental discharge from unspecified firearms or gun, initial encounter: Secondary | ICD-10-CM

## 2015-04-11 DIAGNOSIS — J9600 Acute respiratory failure, unspecified whether with hypoxia or hypercapnia: Secondary | ICD-10-CM

## 2015-04-11 DIAGNOSIS — S31623A Laceration with foreign body of abdominal wall, right lower quadrant with penetration into peritoneal cavity, initial encounter: Secondary | ICD-10-CM | POA: Insufficient documentation

## 2015-04-11 DIAGNOSIS — I4901 Ventricular fibrillation: Secondary | ICD-10-CM | POA: Insufficient documentation

## 2015-04-11 DIAGNOSIS — T794XXA Traumatic shock, initial encounter: Secondary | ICD-10-CM | POA: Insufficient documentation

## 2015-04-11 HISTORY — PX: LAPAROTOMY: SHX154

## 2015-04-11 LAB — CBC
HCT: 31.1 % — ABNORMAL LOW (ref 39.0–52.0)
HEMOGLOBIN: 9.5 g/dL — AB (ref 13.0–17.0)
MCH: 29.3 pg (ref 26.0–34.0)
MCHC: 30.5 g/dL (ref 30.0–36.0)
MCV: 96 fL (ref 78.0–100.0)
Platelets: 116 10*3/uL — ABNORMAL LOW (ref 150–400)
RBC: 3.24 MIL/uL — AB (ref 4.22–5.81)
RDW: 12.4 % (ref 11.5–15.5)
WBC: 6.2 10*3/uL (ref 4.0–10.5)

## 2015-04-11 LAB — COMPREHENSIVE METABOLIC PANEL
ALBUMIN: 2.6 g/dL — AB (ref 3.5–5.0)
ALK PHOS: 37 U/L — AB (ref 38–126)
ALT: 79 U/L — AB (ref 17–63)
AST: 96 U/L — ABNORMAL HIGH (ref 15–41)
BUN: 8 mg/dL (ref 6–20)
CALCIUM: 7.5 mg/dL — AB (ref 8.9–10.3)
CHLORIDE: 123 mmol/L — AB (ref 101–111)
CREATININE: 1.41 mg/dL — AB (ref 0.61–1.24)
GFR calc non Af Amer: >60 mL/min (ref >60)
GLUCOSE: 83 mg/dL (ref 65–99)
Potassium: 3 mmol/L — ABNORMAL LOW (ref 3.5–5.1)
SODIUM: 153 mmol/L — AB (ref 135–145)
Total Bilirubin: 0.4 mg/dL (ref 0.3–1.2)
Total Protein: 4.5 g/dL — ABNORMAL LOW (ref 6.5–8.1)

## 2015-04-11 LAB — CDS SEROLOGY

## 2015-04-11 LAB — ABO/RH: ABO/RH(D): O POS

## 2015-04-11 LAB — BLOOD PRODUCT ORDER (VERBAL) VERIFICATION

## 2015-04-11 LAB — MASSIVE TRANSFUSION PROTOCOL ORDER (BLOOD BANK NOTIFICATION)

## 2015-04-11 LAB — PROTIME-INR
INR: 3.12 — ABNORMAL HIGH (ref 0.00–1.49)
Prothrombin Time: 31.5 seconds — ABNORMAL HIGH (ref 11.6–15.2)

## 2015-04-11 SURGERY — LAPAROTOMY, EXPLORATORY
Anesthesia: General | Site: Abdomen

## 2015-04-11 MED ORDER — FENTANYL CITRATE (PF) 250 MCG/5ML IJ SOLN
INTRAMUSCULAR | Status: DC | PRN
  Administered 2015-04-11: 50 ug via INTRAVENOUS

## 2015-04-11 MED ORDER — ROCURONIUM BROMIDE 100 MG/10ML IV SOLN
INTRAVENOUS | Status: DC | PRN
  Administered 2015-04-11: 50 mg via INTRAVENOUS

## 2015-04-11 MED ORDER — CALCIUM CHLORIDE 10 % IV SOLN
INTRAVENOUS | Status: DC | PRN
  Administered 2015-04-11: 1000 mg via INTRAVENOUS

## 2015-04-11 MED ORDER — ROCURONIUM BROMIDE 50 MG/5ML IV SOLN
INTRAVENOUS | Status: AC
  Filled 2015-04-11: qty 1

## 2015-04-11 MED ORDER — EPINEPHRINE HCL 0.1 MG/ML IJ SOSY
PREFILLED_SYRINGE | INTRAMUSCULAR | Status: AC
  Filled 2015-04-11: qty 10

## 2015-04-11 MED ORDER — SODIUM BICARBONATE 8.4 % IV SOLN
INTRAVENOUS | Status: DC | PRN
  Administered 2015-04-11 (×2): 50 meq via INTRAVENOUS

## 2015-04-11 MED ORDER — FENTANYL CITRATE (PF) 250 MCG/5ML IJ SOLN
INTRAMUSCULAR | Status: AC
  Filled 2015-04-11: qty 5

## 2015-04-11 MED ORDER — 0.9 % SODIUM CHLORIDE (POUR BTL) OPTIME
TOPICAL | Status: DC | PRN
  Administered 2015-04-11: 1000 mL

## 2015-04-11 MED ORDER — MIDAZOLAM HCL 2 MG/2ML IJ SOLN
INTRAMUSCULAR | Status: AC
  Filled 2015-04-11: qty 2

## 2015-04-11 MED ORDER — SODIUM CHLORIDE 0.9 % IV SOLN
INTRAVENOUS | Status: DC | PRN
  Administered 2015-04-11: 05:00:00 via INTRAVENOUS

## 2015-04-11 MED ORDER — MIDAZOLAM HCL 2 MG/2ML IJ SOLN
INTRAMUSCULAR | Status: DC | PRN
  Administered 2015-04-11 (×2): 1 mg via INTRAVENOUS

## 2015-04-11 MED ORDER — CALCIUM CHLORIDE 10 % IV SOLN
INTRAVENOUS | Status: AC
  Filled 2015-04-11: qty 10

## 2015-04-11 MED ORDER — EPHEDRINE SULFATE 50 MG/ML IJ SOLN: INTRAMUSCULAR | Status: DC | PRN

## 2015-04-11 MED ORDER — EPINEPHRINE HCL 0.1 MG/ML IJ SOSY
PREFILLED_SYRINGE | INTRAMUSCULAR | Status: DC | PRN
  Administered 2015-04-11 (×3): 1 mg via INTRAVENOUS

## 2015-04-11 MED ORDER — SODIUM BICARBONATE 8.4 % IV SOLN
INTRAVENOUS | Status: AC
  Filled 2015-04-11: qty 50

## 2015-04-11 MED ORDER — EPINEPHRINE HCL 0.1 MG/ML IJ SOSY
PREFILLED_SYRINGE | INTRAMUSCULAR | Status: AC | PRN
  Administered 2015-04-11: 1 mg via INTRAVENOUS

## 2015-04-11 MED ORDER — SODIUM CHLORIDE 0.9 % IV SOLN
INTRAVENOUS | Status: AC | PRN
  Administered 2015-04-11: 1000 mL via INTRAVENOUS

## 2015-04-11 SURGICAL SUPPLY — 38 items
CHLORAPREP W/TINT 26ML (MISCELLANEOUS) ×3 IMPLANT
CLIP TI LARGE 6 (CLIP) IMPLANT
CLIP TI MEDIUM 6 (CLIP) IMPLANT
CLIP TI WIDE RED SMALL 6 (CLIP) IMPLANT
COVER SURGICAL LIGHT HANDLE (MISCELLANEOUS) ×3 IMPLANT
DRAPE LAPAROSCOPIC ABDOMINAL (DRAPES) ×3 IMPLANT
DRAPE WARM FLUID 44X44 (DRAPE) ×3 IMPLANT
DRSG OPSITE POSTOP 4X10 (GAUZE/BANDAGES/DRESSINGS) IMPLANT
DRSG OPSITE POSTOP 4X8 (GAUZE/BANDAGES/DRESSINGS) IMPLANT
ELECT BLADE 6.5 EXT (BLADE) IMPLANT
ELECT CAUTERY BLADE 6.4 (BLADE) IMPLANT
ELECT REM PT RETURN 9FT ADLT (ELECTROSURGICAL) ×3
ELECTRODE REM PT RTRN 9FT ADLT (ELECTROSURGICAL) ×1 IMPLANT
GLOVE BIO SURGEON STRL SZ7.5 (GLOVE) ×6 IMPLANT
GLOVE BIOGEL PI IND STRL 7.0 (GLOVE) ×1 IMPLANT
GLOVE BIOGEL PI INDICATOR 7.0 (GLOVE) ×2
GLOVE SURG SS PI 7.0 STRL IVOR (GLOVE) ×3 IMPLANT
GOWN STRL REUS W/ TWL LRG LVL3 (GOWN DISPOSABLE) ×2 IMPLANT
GOWN STRL REUS W/TWL LRG LVL3 (GOWN DISPOSABLE) ×4
KIT BASIN OR (CUSTOM PROCEDURE TRAY) ×3 IMPLANT
LIGASURE IMPACT 36 18CM CVD LR (INSTRUMENTS) IMPLANT
LOOP VESSEL MAXI BLUE (MISCELLANEOUS) IMPLANT
LOOP VESSEL MINI RED (MISCELLANEOUS) IMPLANT
PACK GENERAL/GYN (CUSTOM PROCEDURE TRAY) ×3 IMPLANT
SPONGE LAP 18X18 X RAY DECT (DISPOSABLE) ×27 IMPLANT
STAPLER VISISTAT 35W (STAPLE) IMPLANT
SUCTION POOLE TIP (SUCTIONS) ×3 IMPLANT
SUT ETHILON 1 TP 1 60 (SUTURE) ×9 IMPLANT
SUT PDS AB 1 TP1 96 (SUTURE) IMPLANT
SUT SILK 2 0 (SUTURE) ×2
SUT SILK 2 0 SH CR/8 (SUTURE) ×3 IMPLANT
SUT SILK 2-0 18XBRD TIE 12 (SUTURE) ×1 IMPLANT
SUT SILK 3 0 (SUTURE) ×2
SUT SILK 3 0 SH CR/8 (SUTURE) ×3 IMPLANT
SUT SILK 3-0 18XBRD TIE 12 (SUTURE) ×1 IMPLANT
TOWEL OR 17X26 10 PK STRL BLUE (TOWEL DISPOSABLE) ×3 IMPLANT
TRAY FOLEY CATH 14FRSI W/METER (CATHETERS) IMPLANT
YANKAUER SUCT BULB TIP NO VENT (SUCTIONS) IMPLANT

## 2015-04-12 LAB — TYPE AND SCREEN
ABO/RH(D): O POS
Antibody Screen: NEGATIVE
UNIT DIVISION: 0
UNIT DIVISION: 0
UNIT DIVISION: 0
UNIT DIVISION: 0
UNIT DIVISION: 0
UNIT DIVISION: 0
UNIT DIVISION: 0
UNIT DIVISION: 0
UNIT DIVISION: 0
UNIT DIVISION: 0
UNIT DIVISION: 0
UNIT DIVISION: 0
Unit division: 0
Unit division: 0
Unit division: 0
Unit division: 0
Unit division: 0
Unit division: 0
Unit division: 0
Unit division: 0
Unit division: 0
Unit division: 0

## 2015-04-13 ENCOUNTER — Encounter (HOSPITAL_COMMUNITY): Payer: Self-pay | Admitting: General Surgery

## 2015-04-15 ENCOUNTER — Encounter (HOSPITAL_COMMUNITY): Payer: Self-pay | Admitting: *Deleted

## 2015-04-15 LAB — PREPARE FRESH FROZEN PLASMA
UNIT DIVISION: 0
UNIT DIVISION: 0
UNIT DIVISION: 0
UNIT DIVISION: 0
UNIT DIVISION: 0
UNIT DIVISION: 0
Unit division: 0
Unit division: 0
Unit division: 0
Unit division: 0
Unit division: 0
Unit division: 0
Unit division: 0
Unit division: 0

## 2015-04-15 MED FILL — Medication: Qty: 1 | Status: AC

## 2015-04-29 NOTE — Progress Notes (Signed)
RT transported patient from ED to OR without any complications. RT will continue to monitor.

## 2015-04-29 NOTE — Anesthesia Preprocedure Evaluation (Signed)
Anesthesia Evaluation  Patient identified by MRN, date of birth, ID band Patient unresponsive  Preop documentation limited or incomplete due to emergent nature of procedure.  Airway Mallampati: Intubated       Dental   Pulmonary    Pulmonary exam normal        Cardiovascular Normal cardiovascular exam     Neuro/Psych    GI/Hepatic   Endo/Other    Renal/GU      Musculoskeletal   Abdominal   Peds  Hematology   Anesthesia Other Findings   Reproductive/Obstetrics                             Anesthesia Physical Anesthesia Plan  ASA: V and emergent  Anesthesia Plan: General   Post-op Pain Management:    Induction: Intravenous  Airway Management Planned: Oral ETT  Additional Equipment:   Intra-op Plan:   Post-operative Plan: Post-operative intubation/ventilation  Informed Consent:   Plan Discussed with: CRNA and Surgeon  Anesthesia Plan Comments:         Anesthesia Quick Evaluation

## 2015-04-29 NOTE — ED Notes (Signed)
See trauma narrator 
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
## 2015-04-29 NOTE — ED Notes (Signed)
Belongings given to officer 594 and all clothing bagged in Luv Mish bags, plastic bag with white substance removed from bottom and also given to officer. Hands bagged piror to or
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
## 2015-04-29 NOTE — Progress Notes (Signed)
RT assisted ED physician with intubation. ETCO2 positive color change. ETCO2 monitor reading 20. Bilateral breath sounds present. CXR and ABG pending.

## 2015-04-29 DEATH — deceased
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
